# Patient Record
Sex: Female | Born: 1975 | Race: Black or African American | Hispanic: No | Marital: Single | Smoking: Never smoker
Health system: Southern US, Community
[De-identification: ages and names within clinical notes are randomized; demographics above are authoritative.]

## PROBLEM LIST (undated history)

## (undated) DIAGNOSIS — I10 Essential (primary) hypertension: Secondary | ICD-10-CM

## (undated) HISTORY — PX: ABDOMINAL HYSTERECTOMY: SHX81

---

## 2005-08-04 ENCOUNTER — Emergency Department: Payer: Self-pay | Admitting: Emergency Medicine

## 2005-09-25 ENCOUNTER — Emergency Department: Payer: Self-pay | Admitting: Emergency Medicine

## 2006-03-18 ENCOUNTER — Emergency Department: Payer: Self-pay | Admitting: Emergency Medicine

## 2006-04-02 ENCOUNTER — Emergency Department: Payer: Self-pay | Admitting: Emergency Medicine

## 2007-04-17 ENCOUNTER — Emergency Department: Payer: Self-pay | Admitting: Emergency Medicine

## 2013-10-27 ENCOUNTER — Emergency Department: Payer: Self-pay | Admitting: Emergency Medicine

## 2013-10-27 LAB — URINALYSIS, COMPLETE
Bacteria: NONE SEEN
Bilirubin,UR: NEGATIVE
Blood: NEGATIVE
Glucose,UR: NEGATIVE mg/dL (ref 0–75)
Ketone: NEGATIVE
LEUKOCYTE ESTERASE: NEGATIVE
NITRITE: NEGATIVE
PH: 5 (ref 4.5–8.0)
PROTEIN: NEGATIVE
RBC,UR: <1 /HPF (ref 0–5)
Specific Gravity: 1.009 (ref 1.003–1.030)
Squamous Epithelial: <1
WBC UR: NONE SEEN /HPF (ref 0–5)

## 2013-10-27 LAB — GC/CHLAMYDIA PROBE AMP

## 2013-10-27 LAB — WET PREP, GENITAL

## 2014-01-31 ENCOUNTER — Ambulatory Visit: Payer: Self-pay | Admitting: Obstetrics and Gynecology

## 2014-01-31 LAB — CBC
HCT: 33.9 % — ABNORMAL LOW (ref 35.0–47.0)
HGB: 11.2 g/dL — ABNORMAL LOW (ref 12.0–16.0)
MCH: 27.6 pg (ref 26.0–34.0)
MCHC: 32.9 g/dL (ref 32.0–36.0)
MCV: 84 fL (ref 80–100)
PLATELETS: 406 10*3/uL (ref 150–440)
RBC: 4.05 10*6/uL (ref 3.80–5.20)
RDW: 16 % — ABNORMAL HIGH (ref 11.5–14.5)
WBC: 5.9 10*3/uL (ref 3.6–11.0)

## 2014-02-06 ENCOUNTER — Inpatient Hospital Stay: Payer: Self-pay | Admitting: Obstetrics and Gynecology

## 2014-02-07 LAB — HEMOGLOBIN: HGB: 10.2 g/dL — ABNORMAL LOW (ref 12.0–16.0)

## 2014-02-07 LAB — CREATININE, SERUM
Creatinine: 1.01 mg/dL (ref 0.60–1.30)
EGFR (African American): >60

## 2014-02-08 LAB — PATHOLOGY REPORT

## 2014-11-11 NOTE — Op Note (Signed)
PATIENT NAME:  Erica Steele, Erica Steele MR#:  [id number] DATE OF BIRTH:  [birth year]  DATE OF PROCEDURE:  02/06/2014  PREOPERATIVE DIAGNOSES:  1.  An 18-weeks-size fibroid uterus.  2.  Menorrhagia.   POSTOPERATIVE DIAGNOSES:  1.  An 18-weeks-size fibroid uterus.  2.  Menorrhagia.   OPERATIVE PROCEDURE: Total abdominal hysterectomy and bilateral salpingectomy.   SURGEON: Prentice Docker. Welby Montminy, M.D.   FIRST ASSISTANT: None.   ANESTHESIA: General endotracheal.   INDICATIONS: The patient is a 39 year old African American female para 2-0-1-2, status post BTL for contraception, who presents for surgical management of symptomatic fibroid uterus. Preoperative endometrial biopsy was benign with secretory endometrium and hyalinized chorionic villi. Ultrasound demonstrated a multifibroid uterus.   FINDINGS AT SURGERY: An 18-weeks-size multifibroid uterus. The tubes and ovaries were grossly normal.   DESCRIPTION OF PROCEDURE: The patient was brought to the operating room where she was placed in the supine position. General endotracheal anesthesia was induced without difficulty. A ChloraPrep and Betadine abdominal, perineal, intravaginal prep and drape was performed in standard fashion. A Foley catheter was placed and was draining clear yellow urine.   The midline incision was made into the abdomen from the subumbilical region to just above the symphysis pubis. Fascia was incised and extended both superiorly and inferiorly. Peritoneum was entered. The abdomen was explored and the uterus was delivered onto the anterior abdominal wall. Hysterectomy was then performed without the use of a retractor initially because of the size and mobility of the uterus. The round ligaments were doubly clamped, cut, and stick-tied using 0-Vicryl suture. The anterior and posterior leaves of the broad ligament were opened. The uteroovarian ligaments were isolated, doubly clamped and cut. These were stick-tied using 3-0 Vicryl suture.  The procedure was done bilaterally.   Next, the bladder flap was created over the lower uterine segment through sharp dissection. The uterine arteries were skeletonized and doubly clamped and cut with subsequent stick-tie being made with 0-Vicryl suture. The remainder of the cardinal broad ligament complexes were then clamped, cut, and stick-tied down to the level of the internal cervical os. At this point, the uterus was amputated from the operative field in order to provide better visualization.   The remainder of the cervix was then taken down and excised in standard fashion. Clamping, cutting, and stick-tying using 0 Vicryl suture technique was employed. At the cervicovaginal junction and the cervix was crossclamped with curved Heaney clamps and excised from the operative field. The angles of the vagina were closed with 0-Vicryl suture using a Richardson technique. The intervening vagina was then closed with figure-of-eight sutures of 0-Vicryl.   The pelvis was copiously irrigated and hemostasis was noted. The inspection of the adnexa revealed that the tubes were adequately excised. The pedicles were hemostatic. The instrumentation was then removed from the abdominopelvic cavity. The incision was closed in layers using 0-Maxon on the fascia in a supporting manner. The subcutaneous tissues were reapproximated using 2-0 chromic. The skin was closed with staples. A pressure dressing was applied. The patient was then awakened, extubated and taken to the recovery room in satisfactory condition.   ESTIMATED BLOOD LOSS: 250 mL.   INTRAVENOUS FLUIDS: 1500 mL.   URINE OUTPUT: 170 mL.    COUNTS:  All instrument, needle, and sponge counts were verified as correct.   ANTIBIOTICS:  The patient did receive Ancef antibiotic prophylaxis.    ____________________________ Prentice Docker Thorne Wirz, MD mad:lt D: 02/06/2014 11:17:56 ET T: 02/06/2014 12:10:06 ET JOB#: [id number]  cc: Daphine Deutscher A. Tayon Parekh, MD,  <  Dictator> [facility] Prentice DockerMARTIN A Seneca Gadbois MD ELECTRONICALLY SIGNED 02/08/2014 6:12

## 2018-06-05 ENCOUNTER — Encounter (HOSPITAL_BASED_OUTPATIENT_CLINIC_OR_DEPARTMENT_OTHER): Payer: Self-pay | Admitting: Emergency Medicine

## 2018-06-05 ENCOUNTER — Emergency Department (HOSPITAL_BASED_OUTPATIENT_CLINIC_OR_DEPARTMENT_OTHER): Payer: No Typology Code available for payment source

## 2018-06-05 ENCOUNTER — Emergency Department (HOSPITAL_BASED_OUTPATIENT_CLINIC_OR_DEPARTMENT_OTHER)
Admission: EM | Admit: 2018-06-05 | Discharge: 2018-06-05 | Disposition: A | Discharge status: Home / Self Care | Payer: No Typology Code available for payment source | Attending: Emergency Medicine | Admitting: Emergency Medicine

## 2018-06-05 ENCOUNTER — Other Ambulatory Visit: Payer: Self-pay

## 2018-06-05 DIAGNOSIS — I10 Essential (primary) hypertension: Secondary | ICD-10-CM | POA: Insufficient documentation

## 2018-06-05 DIAGNOSIS — S4992XA Unspecified injury of left shoulder and upper arm, initial encounter: Secondary | ICD-10-CM | POA: Diagnosis present

## 2018-06-05 DIAGNOSIS — M7918 Myalgia, other site: Secondary | ICD-10-CM

## 2018-06-05 DIAGNOSIS — Y93I9 Activity, other involving external motion: Secondary | ICD-10-CM | POA: Diagnosis not present

## 2018-06-05 DIAGNOSIS — Y998 Other external cause status: Secondary | ICD-10-CM | POA: Insufficient documentation

## 2018-06-05 DIAGNOSIS — Y92410 Unspecified street and highway as the place of occurrence of the external cause: Secondary | ICD-10-CM | POA: Insufficient documentation

## 2018-06-05 DIAGNOSIS — S59912A Unspecified injury of left forearm, initial encounter: Secondary | ICD-10-CM | POA: Diagnosis not present

## 2018-06-05 HISTORY — DX: Essential (primary) hypertension: I10

## 2018-06-05 MED ORDER — CYCLOBENZAPRINE HCL 10 MG PO TABS: 10.0000 mg | ORAL_TABLET | Freq: Two times a day (BID) | ORAL | 0 refills | Status: AC | PRN

## 2018-06-05 MED ORDER — IBUPROFEN 800 MG PO TABS
800.0000 mg | ORAL_TABLET | Freq: Once | ORAL | Status: AC
  Administered 2018-06-05: 800 mg via ORAL
  Filled 2018-06-05: qty 1
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
## 2019-07-26 ENCOUNTER — Telehealth: Payer: Self-pay

## 2019-08-19 NOTE — Telephone Encounter (Signed)
Encounter opened in error
